# Patient Record
Sex: Female | Born: 1950 | Race: White | Hispanic: No | Marital: Married | Smoking: Never smoker
Health system: Southern US, Community
[De-identification: ages and names within clinical notes are randomized; demographics above are authoritative.]

## PROBLEM LIST (undated history)

## (undated) DIAGNOSIS — T7840XA Allergy, unspecified, initial encounter: Secondary | ICD-10-CM

## (undated) DIAGNOSIS — E0590 Thyrotoxicosis, unspecified without thyrotoxic crisis or storm: Secondary | ICD-10-CM

## (undated) DIAGNOSIS — R42 Dizziness and giddiness: Secondary | ICD-10-CM

## (undated) DIAGNOSIS — M1990 Unspecified osteoarthritis, unspecified site: Secondary | ICD-10-CM

## (undated) DIAGNOSIS — H269 Unspecified cataract: Secondary | ICD-10-CM

## (undated) DIAGNOSIS — K5790 Diverticulosis of intestine, part unspecified, without perforation or abscess without bleeding: Secondary | ICD-10-CM

## (undated) DIAGNOSIS — M810 Age-related osteoporosis without current pathological fracture: Secondary | ICD-10-CM

## (undated) HISTORY — PX: BLADDER REPAIR: SHX76

## (undated) HISTORY — DX: Dizziness and giddiness: R42

## (undated) HISTORY — DX: Allergy, unspecified, initial encounter: T78.40XA

## (undated) HISTORY — DX: Diverticulosis of intestine, part unspecified, without perforation or abscess without bleeding: K57.90

## (undated) HISTORY — DX: Unspecified cataract: H26.9

## (undated) HISTORY — DX: Unspecified osteoarthritis, unspecified site: M19.90

## (undated) HISTORY — DX: Age-related osteoporosis without current pathological fracture: M81.0

## (undated) HISTORY — DX: Thyrotoxicosis, unspecified without thyrotoxic crisis or storm: E05.90

---
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
## 1980-01-03 HISTORY — PX: VAGINAL HYSTERECTOMY: SHX2639

## 1985-01-02 HISTORY — PX: APPENDECTOMY: SHX54

## 1997-10-12 ENCOUNTER — Other Ambulatory Visit: Admission: RE | Admit: 1997-10-12 | Discharge: 1997-10-12 | Payer: Self-pay | Admitting: *Deleted

## 1999-09-14 ENCOUNTER — Other Ambulatory Visit: Admission: RE | Admit: 1999-09-14 | Discharge: 1999-09-14 | Payer: Self-pay | Admitting: *Deleted

## 2004-09-06 ENCOUNTER — Ambulatory Visit: Payer: Self-pay | Admitting: Oncology

## 2004-12-15 ENCOUNTER — Ambulatory Visit: Payer: Self-pay | Admitting: Oncology

## 2005-04-30 ENCOUNTER — Ambulatory Visit: Payer: Self-pay | Admitting: Oncology

## 2005-05-02 LAB — CBC WITH DIFFERENTIAL/PLATELET
BASO%: 0.4 % (ref 0.0–2.0)
EOS%: 2 % (ref 0.0–7.0)
LYMPH%: 39.2 % (ref 14.0–48.0)
MCH: 31.3 pg (ref 26.0–34.0)
MCHC: 34.2 g/dL (ref 32.0–36.0)
MCV: 91.8 fL (ref 81.0–101.0)
MONO#: 0.4 10*3/uL (ref 0.1–0.9)
MONO%: 6.8 % (ref 0.0–13.0)
NEUT%: 51.6 % (ref 39.6–76.8)
Platelets: 191 10*3/uL (ref 145–400)
RBC: 4.15 10*6/uL (ref 3.70–5.32)
WBC: 6.2 10*3/uL (ref 3.9–10.0)

## 2005-10-31 ENCOUNTER — Ambulatory Visit: Payer: Self-pay

## 2007-12-02 ENCOUNTER — Ambulatory Visit: Payer: Self-pay | Admitting: Oncology

## 2011-07-14 ENCOUNTER — Encounter: Payer: Self-pay | Admitting: Internal Medicine

## 2012-01-26 ENCOUNTER — Encounter: Payer: Self-pay | Admitting: Internal Medicine

## 2012-03-02 HISTORY — PX: OTHER SURGICAL HISTORY: SHX169

## 2012-03-13 ENCOUNTER — Ambulatory Visit (AMBULATORY_SURGERY_CENTER): Payer: BC Managed Care – PPO | Admitting: *Deleted

## 2012-03-13 VITALS — Ht 62.0 in | Wt 146.8 lb

## 2012-03-13 MED ORDER — MOVIPREP 100 G PO SOLR: ORAL | Status: DC

## 2012-03-14 ENCOUNTER — Encounter: Payer: Self-pay | Admitting: Internal Medicine

## 2012-03-15 ENCOUNTER — Encounter: Payer: Self-pay | Admitting: Internal Medicine

## 2012-03-26 ENCOUNTER — Ambulatory Visit (AMBULATORY_SURGERY_CENTER): Payer: BC Managed Care – PPO | Admitting: Internal Medicine

## 2012-03-26 ENCOUNTER — Encounter: Payer: Self-pay | Admitting: Internal Medicine

## 2012-03-26 VITALS — BP 115/66 | HR 67 | Temp 97.9°F | Resp 17 | Ht 62.0 in | Wt 146.0 lb

## 2012-03-26 DIAGNOSIS — Z1211 Encounter for screening for malignant neoplasm of colon: Secondary | ICD-10-CM

## 2012-03-26 MED ORDER — SODIUM CHLORIDE 0.9 % IV SOLN: 500.0000 mL | INTRAVENOUS | Status: DC

## 2012-03-26 NOTE — Patient Instructions (Addendum)

## 2012-03-26 NOTE — Progress Notes (Signed)
Patient did not experience any of the following events: a burn prior to discharge; a fall within the facility; wrong site/side/patient/procedure/implant event; or a hospital transfer or hospital admission upon discharge from the facility. (G8907) Patient did not have preoperative order for IV antibiotic SSI prophylaxis. (G8918)  

## 2012-03-26 NOTE — Op Note (Signed)
[facility] [address] [place], [address]   COLONOSCOPY PROCEDURE REPORT  PATIENT: Leslie Ashley, Leslie Ashley  MR#: [id number] BIRTHDATE: [birth year] , 62  yrs. old GENDER: Female ENDOSCOPIST: Hart Carwin, MD REFERRED BY:  Guerry Bruin, M.D. PROCEDURE DATE:  03/26/2012 PROCEDURE:   Colonoscopy, screening ASA CLASS:   Class II INDICATIONS:Average risk patient for colon cancer and last colon 09/2001- int.  hems. MEDICATIONS: MAC sedation, administered by CRNA and propofol (Diprivan) 250mg  IV  DESCRIPTION OF PROCEDURE:   After the risks and benefits and of the procedure were explained, informed consent was obtained.  A digital rectal exam revealed no abnormalities of the rectum.    The [facility] [id number]-[id number]  endoscope was introduced through the anus and advanced to the cecum, which was identified by both the appendix and ileocecal valve .  The quality of the prep was good, using MoviPrep .  The instrument was then slowly withdrawn as the colon was fully examined.     COLON FINDINGS: Mild diverticulosis was noted in the sigmoid colon. Retroflexed views revealed no abnormalities.     The scope was then withdrawn from the patient and the procedure completed.  COMPLICATIONS: There were no complications. ENDOSCOPIC IMPRESSION: Mild diverticulosis was noted in the sigmoid colon  RECOMMENDATIONS: High fiber diet   REPEAT EXAM: In 10 year(s)  for Colonoscopy.  cc:  _______________________________ eSignedHart Carwin, MD 03/26/2012 10:46 AM

## 2012-03-27 ENCOUNTER — Telehealth: Payer: Self-pay

## 2012-03-27 NOTE — Telephone Encounter (Signed)
  Follow up Call-  Call back number 03/26/2012  Post procedure Call Back phone  # [phone]  Permission to leave phone message Yes     Patient questions:  Do you have a fever, pain , or abdominal swelling? no Pain Score  0 *  Have you tolerated food without any problems? yes  Have you been able to return to your normal activities? yes  Do you have any questions about your discharge instructions: Diet   no Medications  no Follow up visit  no  Do you have questions or concerns about your Care? no  Actions: * If pain score is 4 or above: No action needed, pain <4.

## 2013-12-23 ENCOUNTER — Encounter: Payer: Self-pay | Admitting: *Deleted

## 2014-01-20 DIAGNOSIS — D649 Anemia, unspecified: Secondary | ICD-10-CM | POA: Diagnosis not present

## 2014-01-20 DIAGNOSIS — K529 Noninfective gastroenteritis and colitis, unspecified: Secondary | ICD-10-CM | POA: Diagnosis not present

## 2014-01-20 DIAGNOSIS — K633 Ulcer of intestine: Secondary | ICD-10-CM | POA: Diagnosis not present

## 2014-02-05 DIAGNOSIS — D649 Anemia, unspecified: Secondary | ICD-10-CM | POA: Diagnosis not present

## 2014-02-05 DIAGNOSIS — D509 Iron deficiency anemia, unspecified: Secondary | ICD-10-CM | POA: Diagnosis not present

## 2014-02-17 ENCOUNTER — Ambulatory Visit: Payer: BC Managed Care – PPO | Admitting: Internal Medicine

## 2014-02-18 ENCOUNTER — Encounter: Payer: Self-pay | Admitting: Internal Medicine

## 2014-02-18 NOTE — Progress Notes (Signed)
Patient ID: Leslie Ashley, female   DOB: 1950/08/12, 64 y.o.   MRN: [id number] The patient's chart has been reviewed by Dr. Olevia Perches  and the recommendations are noted below.  Follow-up advised. Schedule patient for next available appointment. Outcome of communication with the patient:  Letter mailed

## 2014-08-10 DIAGNOSIS — D6489 Other specified anemias: Secondary | ICD-10-CM | POA: Diagnosis not present

## 2014-08-10 DIAGNOSIS — K5190 Ulcerative colitis, unspecified, without complications: Secondary | ICD-10-CM | POA: Diagnosis not present

## 2015-01-07 DIAGNOSIS — Z13820 Encounter for screening for osteoporosis: Secondary | ICD-10-CM | POA: Diagnosis not present

## 2015-01-07 DIAGNOSIS — M810 Age-related osteoporosis without current pathological fracture: Secondary | ICD-10-CM | POA: Diagnosis not present

## 2015-01-07 DIAGNOSIS — Z780 Asymptomatic menopausal state: Secondary | ICD-10-CM | POA: Diagnosis not present

## 2015-01-07 DIAGNOSIS — Z1231 Encounter for screening mammogram for malignant neoplasm of breast: Secondary | ICD-10-CM | POA: Diagnosis not present

## 2015-03-31 DIAGNOSIS — H524 Presbyopia: Secondary | ICD-10-CM | POA: Diagnosis not present

## 2015-03-31 DIAGNOSIS — H35363 Drusen (degenerative) of macula, bilateral: Secondary | ICD-10-CM | POA: Diagnosis not present

## 2015-03-31 DIAGNOSIS — H2513 Age-related nuclear cataract, bilateral: Secondary | ICD-10-CM | POA: Diagnosis not present

## 2015-05-23 DIAGNOSIS — M1990 Unspecified osteoarthritis, unspecified site: Secondary | ICD-10-CM | POA: Diagnosis not present

## 2015-05-23 DIAGNOSIS — Y998 Other external cause status: Secondary | ICD-10-CM | POA: Diagnosis not present

## 2015-05-23 DIAGNOSIS — S0501XA Injury of conjunctiva and corneal abrasion without foreign body, right eye, initial encounter: Secondary | ICD-10-CM | POA: Diagnosis not present

## 2015-05-23 DIAGNOSIS — E039 Hypothyroidism, unspecified: Secondary | ICD-10-CM | POA: Diagnosis not present

## 2015-09-15 DIAGNOSIS — Z23 Encounter for immunization: Secondary | ICD-10-CM | POA: Diagnosis not present

## 2015-12-21 DIAGNOSIS — E0500 Thyrotoxicosis with diffuse goiter without thyrotoxic crisis or storm: Secondary | ICD-10-CM | POA: Diagnosis not present

## 2015-12-21 DIAGNOSIS — E7800 Pure hypercholesterolemia, unspecified: Secondary | ICD-10-CM | POA: Diagnosis not present

## 2015-12-21 DIAGNOSIS — Z0000 Encounter for general adult medical examination without abnormal findings: Secondary | ICD-10-CM | POA: Diagnosis not present

## 2015-12-21 DIAGNOSIS — M810 Age-related osteoporosis without current pathological fracture: Secondary | ICD-10-CM | POA: Diagnosis not present

## 2015-12-22 DIAGNOSIS — D696 Thrombocytopenia, unspecified: Secondary | ICD-10-CM | POA: Diagnosis not present

## 2015-12-22 DIAGNOSIS — H8110 Benign paroxysmal vertigo, unspecified ear: Secondary | ICD-10-CM | POA: Diagnosis not present

## 2015-12-22 DIAGNOSIS — Z6828 Body mass index (BMI) 28.0-28.9, adult: Secondary | ICD-10-CM | POA: Diagnosis not present

## 2015-12-22 DIAGNOSIS — D509 Iron deficiency anemia, unspecified: Secondary | ICD-10-CM | POA: Diagnosis not present

## 2015-12-22 DIAGNOSIS — E0500 Thyrotoxicosis with diffuse goiter without thyrotoxic crisis or storm: Secondary | ICD-10-CM | POA: Diagnosis not present

## 2015-12-22 DIAGNOSIS — E7800 Pure hypercholesterolemia, unspecified: Secondary | ICD-10-CM | POA: Diagnosis not present

## 2015-12-22 DIAGNOSIS — M1990 Unspecified osteoarthritis, unspecified site: Secondary | ICD-10-CM | POA: Diagnosis not present

## 2015-12-22 DIAGNOSIS — Z0000 Encounter for general adult medical examination without abnormal findings: Secondary | ICD-10-CM | POA: Diagnosis not present

## 2015-12-22 DIAGNOSIS — Z23 Encounter for immunization: Secondary | ICD-10-CM | POA: Diagnosis not present

## 2015-12-22 DIAGNOSIS — M810 Age-related osteoporosis without current pathological fracture: Secondary | ICD-10-CM | POA: Diagnosis not present

## 2016-01-13 DIAGNOSIS — Z1212 Encounter for screening for malignant neoplasm of rectum: Secondary | ICD-10-CM | POA: Diagnosis not present

## 2016-01-18 DIAGNOSIS — N6002 Solitary cyst of left breast: Secondary | ICD-10-CM | POA: Diagnosis not present

## 2016-01-18 DIAGNOSIS — N6320 Unspecified lump in the left breast, unspecified quadrant: Secondary | ICD-10-CM | POA: Diagnosis not present

## 2016-03-13 DIAGNOSIS — H25013 Cortical age-related cataract, bilateral: Secondary | ICD-10-CM | POA: Diagnosis not present

## 2016-03-13 DIAGNOSIS — H35363 Drusen (degenerative) of macula, bilateral: Secondary | ICD-10-CM | POA: Diagnosis not present

## 2016-03-13 DIAGNOSIS — H524 Presbyopia: Secondary | ICD-10-CM | POA: Diagnosis not present

## 2016-03-13 DIAGNOSIS — H35033 Hypertensive retinopathy, bilateral: Secondary | ICD-10-CM | POA: Diagnosis not present

## 2016-03-13 DIAGNOSIS — H2513 Age-related nuclear cataract, bilateral: Secondary | ICD-10-CM | POA: Diagnosis not present

## 2016-04-03 ENCOUNTER — Emergency Department (HOSPITAL_COMMUNITY)
Admission: EM | Admit: 2016-04-03 | Discharge: 2016-04-03 | Disposition: A | Discharge status: Home / Self Care | Payer: Medicare Other | Attending: Emergency Medicine | Admitting: Emergency Medicine

## 2016-04-03 ENCOUNTER — Emergency Department (HOSPITAL_COMMUNITY): Payer: Medicare Other

## 2016-04-03 ENCOUNTER — Encounter (HOSPITAL_COMMUNITY): Payer: Self-pay | Admitting: Emergency Medicine

## 2016-04-03 DIAGNOSIS — J029 Acute pharyngitis, unspecified: Secondary | ICD-10-CM | POA: Diagnosis not present

## 2016-04-03 DIAGNOSIS — J0100 Acute maxillary sinusitis, unspecified: Secondary | ICD-10-CM | POA: Diagnosis not present

## 2016-04-03 DIAGNOSIS — Z7982 Long term (current) use of aspirin: Secondary | ICD-10-CM | POA: Insufficient documentation

## 2016-04-03 DIAGNOSIS — R05 Cough: Secondary | ICD-10-CM | POA: Diagnosis present

## 2016-04-03 DIAGNOSIS — Z79899 Other long term (current) drug therapy: Secondary | ICD-10-CM | POA: Insufficient documentation

## 2016-04-03 LAB — I-STAT CHEM 8, ED
BUN: 15 mg/dL (ref 6–20)
CALCIUM ION: 1.19 mmol/L (ref 1.15–1.40)
CHLORIDE: 101 mmol/L (ref 101–111)
Creatinine, Ser: 0.7 mg/dL (ref 0.44–1.00)
GLUCOSE: 91 mg/dL (ref 65–99)
HCT: 39 % (ref 36.0–46.0)
Hemoglobin: 13.3 g/dL (ref 12.0–15.0)
Potassium: 3.8 mmol/L (ref 3.5–5.1)
Sodium: 138 mmol/L (ref 135–145)
TCO2: 28 mmol/L (ref 0–100)

## 2016-04-03 MED ORDER — AMOXICILLIN 500 MG PO CAPS: 500.0000 mg | ORAL_CAPSULE | Freq: Three times a day (TID) | ORAL | 0 refills | Status: AC

## 2016-04-03 NOTE — ED Triage Notes (Signed)
Pt c/o sore throat onset 2 days ago, yesterday onset sweats, chills, sinus pain, sneezing, sinus drainage.

## 2016-04-03 NOTE — Discharge Instructions (Signed)
Drink plenty of fluids take Tylenol or Motrin for fever or aches. Follow-up next week if not improving

## 2016-04-03 NOTE — ED Notes (Signed)
PT DISCHARGED. INSTRUCTIONS AND PRESCRIPTION GIVEN. AAOX4. PT IN NO APPARENT DISTRESS OR PAIN. THE OPPORTUNITY TO ASK QUESTIONS WAS PROVIDED. 

## 2016-04-03 NOTE — ED Provider Notes (Signed)
[facility] DEPT Provider Note   CSN: [id number] Arrival date & time: 04/03/16  1118     History   Chief Complaint Chief Complaint  Patient presents with  . Cough    HPI Leslie Ashley is a 66 y.o. female.  Patient complains a sore throat cough and sinus congestion. Patient exercises are full and she has a lot of discharge from her nose    Cough  This is a new problem. The current episode started more than 2 days ago. The problem occurs constantly. The problem has not changed since onset.The cough is non-productive. There has been no fever (Patient does complain of chills). Pertinent negatives include no chest pain and no headaches. She has tried nothing for the symptoms. The treatment provided no relief.    Past Medical History:  Diagnosis Date  . Allergy   . Cataract   . Diverticulosis   . Hyperthyroidism   . Osteoarthritis   . Osteoporosis   . Vertigo     There are no active problems to display for this patient.   Past Surgical History:  Procedure Laterality Date  . APPENDECTOMY  1987  . [place], 2004  . growth ear  1956   bilateral  . TONSILLECTOMY  1956  . tube in ear  03/2012   right  . VAGINAL HYSTERECTOMY  1982    OB History    No data available       Home Medications    Prior to Admission medications   Medication Sig Start Date End Date Taking? Authorizing Provider  alendronate (FOSAMAX) 70 MG tablet Take 70 mg by mouth every 7 (seven) days. Take with a full glass of water on an empty stomach.    Historical Provider, MD  amoxicillin (AMOXIL) 500 MG capsule Take 1 capsule (500 mg total) by mouth 3 (three) times daily. 04/03/16   Milton Ferguson, MD  aspirin 81 MG tablet Take 81 mg by mouth daily.    Historical Provider, MD  Calcium Carbonate-Vitamin D (CALCIUM + D PO) Take by mouth. Takes 3000 mg daily    Historical Provider, MD  Cholecalciferol (VITAMIN D PO) Take by mouth every other day.    Historical Provider, MD  diazepam  (VALIUM) 5 MG tablet Take 5 mg by mouth as needed for anxiety.    Historical Provider, MD  levothyroxine (SYNTHROID, LEVOTHROID) 137 MCG tablet Take 137 mcg by mouth daily.    Historical Provider, MD  MELATONIN ER PO Take by mouth at bedtime.    Historical Provider, MD  Multiple Vitamin (MULTIVITAMIN) tablet Take 1 tablet by mouth 2 (two) times daily.    Historical Provider, MD  triamterene-hydrochlorothiazide (DYAZIDE) 37.5-25 MG per capsule  03/26/12   Historical Provider, MD    Family History Family History  Problem Relation Age of Onset  . Colon cancer Neg Hx   . Stomach cancer Neg Hx     Social History Social History  Substance Use Topics  . Smoking status: Never Smoker  . Smokeless tobacco: Never Used  . Alcohol use No     Comment: rare     Allergies   Patient has no known allergies.   Review of Systems Review of Systems  Constitutional: Negative for appetite change and fatigue.  HENT: Positive for congestion. Negative for ear discharge and sinus pressure.        Frontal and maxillary sinus pain  Eyes: Negative for discharge.  Respiratory: Positive for cough.   Cardiovascular: Negative for  chest pain.  Gastrointestinal: Negative for abdominal pain and diarrhea.  Genitourinary: Negative for frequency and hematuria.  Musculoskeletal: Negative for back pain.  Skin: Negative for rash.  Neurological: Negative for seizures and headaches.  Psychiatric/Behavioral: Negative for hallucinations.     Physical Exam Updated Vital Signs BP 132/61   Pulse 66   Temp 97.7 F (36.5 C) (Oral)   Resp 20   Ht 5\' 2"  (1.575 m)   Wt 146 lb (66.2 kg)   SpO2 100%   BMI 26.70 kg/m   Physical Exam  Constitutional: She is oriented to person, place, and time. She appears well-developed.  HENT:  Head: Normocephalic.  Tenderness over maxillary sinuses  Eyes: Conjunctivae and EOM are normal. No scleral icterus.  Neck: Neck supple. No thyromegaly present.  Cardiovascular: Normal  rate and regular rhythm.  Exam reveals no gallop and no friction rub.   No murmur heard. Pulmonary/Chest: No stridor. She has no wheezes. She has no rales. She exhibits no tenderness.  Abdominal: She exhibits no distension. There is no tenderness. There is no rebound.  Musculoskeletal: Normal range of motion. She exhibits no edema.  Lymphadenopathy:    She has no cervical adenopathy.  Neurological: She is oriented to person, place, and time. She exhibits normal muscle tone. Coordination normal.  Skin: No rash noted. No erythema.  Psychiatric: She has a normal mood and affect. Her behavior is normal.     ED Treatments / Results  Labs (all labs ordered are listed, but only abnormal results are displayed) Labs Reviewed  I-STAT CHEM 8, ED    EKG  EKG Interpretation None       Radiology Dg Chest 2 View  Result Date: 04/03/2016 CLINICAL DATA:  Sore throat onset 2 days ago. EXAM: CHEST  2 VIEW COMPARISON:  None. FINDINGS: The heart size and mediastinal contours are within normal limits. Both lungs are clear. The visualized skeletal structures are unremarkable. IMPRESSION: No active cardiopulmonary disease. Electronically Signed   By: Kathreen Devoid   On: 04/03/2016 12:27    Procedures Procedures (including critical care time)  Medications Ordered in ED Medications - No data to display   Initial Impression / Assessment and Plan / ED Course  I have reviewed the triage vital signs and the nursing notes.  Pertinent labs & imaging results that were available during my care of the patient were reviewed by me and considered in my medical decision making (see chart for details).     Suspect sinusitis patient will be placed on amoxicillin and follow-up with her PCP if not improving  Final Clinical Impressions(s) / ED Diagnoses   Final diagnoses:  Subacute maxillary sinusitis    New Prescriptions New Prescriptions   AMOXICILLIN (AMOXIL) 500 MG CAPSULE    Take 1 capsule (500 mg  total) by mouth 3 (three) times daily.     Milton Ferguson, MD 04/03/16 [phone]

## 2016-07-31 DIAGNOSIS — H209 Unspecified iridocyclitis: Secondary | ICD-10-CM | POA: Diagnosis not present

## 2016-07-31 DIAGNOSIS — H18892 Other specified disorders of cornea, left eye: Secondary | ICD-10-CM | POA: Diagnosis not present

## 2016-08-17 DIAGNOSIS — H18892 Other specified disorders of cornea, left eye: Secondary | ICD-10-CM | POA: Diagnosis not present

## 2016-08-17 DIAGNOSIS — H209 Unspecified iridocyclitis: Secondary | ICD-10-CM | POA: Diagnosis not present

## 2016-08-17 DIAGNOSIS — H01009 Unspecified blepharitis unspecified eye, unspecified eyelid: Secondary | ICD-10-CM | POA: Diagnosis not present

## 2016-12-11 DIAGNOSIS — Z23 Encounter for immunization: Secondary | ICD-10-CM | POA: Diagnosis not present

## 2017-01-04 DIAGNOSIS — E0500 Thyrotoxicosis with diffuse goiter without thyrotoxic crisis or storm: Secondary | ICD-10-CM | POA: Diagnosis not present

## 2017-01-04 DIAGNOSIS — M810 Age-related osteoporosis without current pathological fracture: Secondary | ICD-10-CM | POA: Diagnosis not present

## 2017-01-04 DIAGNOSIS — R82998 Other abnormal findings in urine: Secondary | ICD-10-CM | POA: Diagnosis not present

## 2017-01-04 DIAGNOSIS — E7800 Pure hypercholesterolemia, unspecified: Secondary | ICD-10-CM | POA: Diagnosis not present

## 2017-01-11 DIAGNOSIS — Z1389 Encounter for screening for other disorder: Secondary | ICD-10-CM | POA: Diagnosis not present

## 2017-01-11 DIAGNOSIS — Z6829 Body mass index (BMI) 29.0-29.9, adult: Secondary | ICD-10-CM | POA: Diagnosis not present

## 2017-01-11 DIAGNOSIS — M810 Age-related osteoporosis without current pathological fracture: Secondary | ICD-10-CM | POA: Diagnosis not present

## 2017-01-11 DIAGNOSIS — E0500 Thyrotoxicosis with diffuse goiter without thyrotoxic crisis or storm: Secondary | ICD-10-CM | POA: Diagnosis not present

## 2017-01-11 DIAGNOSIS — M1990 Unspecified osteoarthritis, unspecified site: Secondary | ICD-10-CM | POA: Diagnosis not present

## 2017-01-11 DIAGNOSIS — E7800 Pure hypercholesterolemia, unspecified: Secondary | ICD-10-CM | POA: Diagnosis not present

## 2017-01-11 DIAGNOSIS — Z0000 Encounter for general adult medical examination without abnormal findings: Secondary | ICD-10-CM | POA: Diagnosis not present

## 2017-01-16 DIAGNOSIS — Z1212 Encounter for screening for malignant neoplasm of rectum: Secondary | ICD-10-CM | POA: Diagnosis not present

## 2017-01-22 DIAGNOSIS — Z1231 Encounter for screening mammogram for malignant neoplasm of breast: Secondary | ICD-10-CM | POA: Diagnosis not present

## 2017-03-13 DIAGNOSIS — H35033 Hypertensive retinopathy, bilateral: Secondary | ICD-10-CM | POA: Diagnosis not present

## 2017-03-13 DIAGNOSIS — H35363 Drusen (degenerative) of macula, bilateral: Secondary | ICD-10-CM | POA: Diagnosis not present

## 2017-03-13 DIAGNOSIS — H01009 Unspecified blepharitis unspecified eye, unspecified eyelid: Secondary | ICD-10-CM | POA: Diagnosis not present

## 2017-03-13 DIAGNOSIS — H25013 Cortical age-related cataract, bilateral: Secondary | ICD-10-CM | POA: Diagnosis not present

## 2017-03-13 DIAGNOSIS — H2513 Age-related nuclear cataract, bilateral: Secondary | ICD-10-CM | POA: Diagnosis not present

## 2017-04-11 DIAGNOSIS — H01009 Unspecified blepharitis unspecified eye, unspecified eyelid: Secondary | ICD-10-CM | POA: Diagnosis not present

## 2017-04-11 DIAGNOSIS — H2513 Age-related nuclear cataract, bilateral: Secondary | ICD-10-CM | POA: Diagnosis not present

## 2017-04-11 DIAGNOSIS — H25013 Cortical age-related cataract, bilateral: Secondary | ICD-10-CM | POA: Diagnosis not present

## 2017-04-11 DIAGNOSIS — H35033 Hypertensive retinopathy, bilateral: Secondary | ICD-10-CM | POA: Diagnosis not present

## 2017-04-11 DIAGNOSIS — H35363 Drusen (degenerative) of macula, bilateral: Secondary | ICD-10-CM | POA: Diagnosis not present

## 2017-05-01 DIAGNOSIS — H2513 Age-related nuclear cataract, bilateral: Secondary | ICD-10-CM | POA: Diagnosis not present

## 2017-05-01 DIAGNOSIS — H25013 Cortical age-related cataract, bilateral: Secondary | ICD-10-CM | POA: Diagnosis not present

## 2017-05-01 DIAGNOSIS — H2511 Age-related nuclear cataract, right eye: Secondary | ICD-10-CM | POA: Diagnosis not present

## 2017-05-23 DIAGNOSIS — H25811 Combined forms of age-related cataract, right eye: Secondary | ICD-10-CM | POA: Diagnosis not present

## 2017-05-23 DIAGNOSIS — H2511 Age-related nuclear cataract, right eye: Secondary | ICD-10-CM | POA: Diagnosis not present

## 2017-05-30 DIAGNOSIS — H2512 Age-related nuclear cataract, left eye: Secondary | ICD-10-CM | POA: Diagnosis not present

## 2017-05-30 DIAGNOSIS — H25012 Cortical age-related cataract, left eye: Secondary | ICD-10-CM | POA: Diagnosis not present

## 2017-06-06 DIAGNOSIS — H25812 Combined forms of age-related cataract, left eye: Secondary | ICD-10-CM | POA: Diagnosis not present

## 2017-06-06 DIAGNOSIS — H2512 Age-related nuclear cataract, left eye: Secondary | ICD-10-CM | POA: Diagnosis not present

## 2017-06-21 DIAGNOSIS — M1812 Unilateral primary osteoarthritis of first carpometacarpal joint, left hand: Secondary | ICD-10-CM | POA: Diagnosis not present

## 2017-06-21 DIAGNOSIS — M65311 Trigger thumb, right thumb: Secondary | ICD-10-CM | POA: Diagnosis not present

## 2017-06-21 DIAGNOSIS — M1811 Unilateral primary osteoarthritis of first carpometacarpal joint, right hand: Secondary | ICD-10-CM | POA: Diagnosis not present

## 2017-07-16 DIAGNOSIS — M65311 Trigger thumb, right thumb: Secondary | ICD-10-CM | POA: Diagnosis not present

## 2017-07-16 DIAGNOSIS — M180 Bilateral primary osteoarthritis of first carpometacarpal joints: Secondary | ICD-10-CM | POA: Diagnosis not present

## 2017-09-07 DIAGNOSIS — Z23 Encounter for immunization: Secondary | ICD-10-CM | POA: Diagnosis not present

## 2017-10-05 DIAGNOSIS — D3611 Benign neoplasm of peripheral nerves and autonomic nervous system of face, head, and neck: Secondary | ICD-10-CM | POA: Diagnosis not present

## 2017-10-05 DIAGNOSIS — L821 Other seborrheic keratosis: Secondary | ICD-10-CM | POA: Diagnosis not present

## 2017-10-05 DIAGNOSIS — D2339 Other benign neoplasm of skin of other parts of face: Secondary | ICD-10-CM | POA: Diagnosis not present

## 2017-10-05 DIAGNOSIS — D485 Neoplasm of uncertain behavior of skin: Secondary | ICD-10-CM | POA: Diagnosis not present

## 2017-10-08 DIAGNOSIS — E0500 Thyrotoxicosis with diffuse goiter without thyrotoxic crisis or storm: Secondary | ICD-10-CM | POA: Diagnosis not present

## 2017-10-08 DIAGNOSIS — H04123 Dry eye syndrome of bilateral lacrimal glands: Secondary | ICD-10-CM | POA: Diagnosis not present

## 2017-10-26 IMAGING — CR DG CHEST 2V
2 series · 2 of 2 positions shown · non-contrast
Comparison: None.

CLINICAL DATA: Sore throat onset 2 days ago.

EXAM:
CHEST  2 VIEW

[w chest pa]
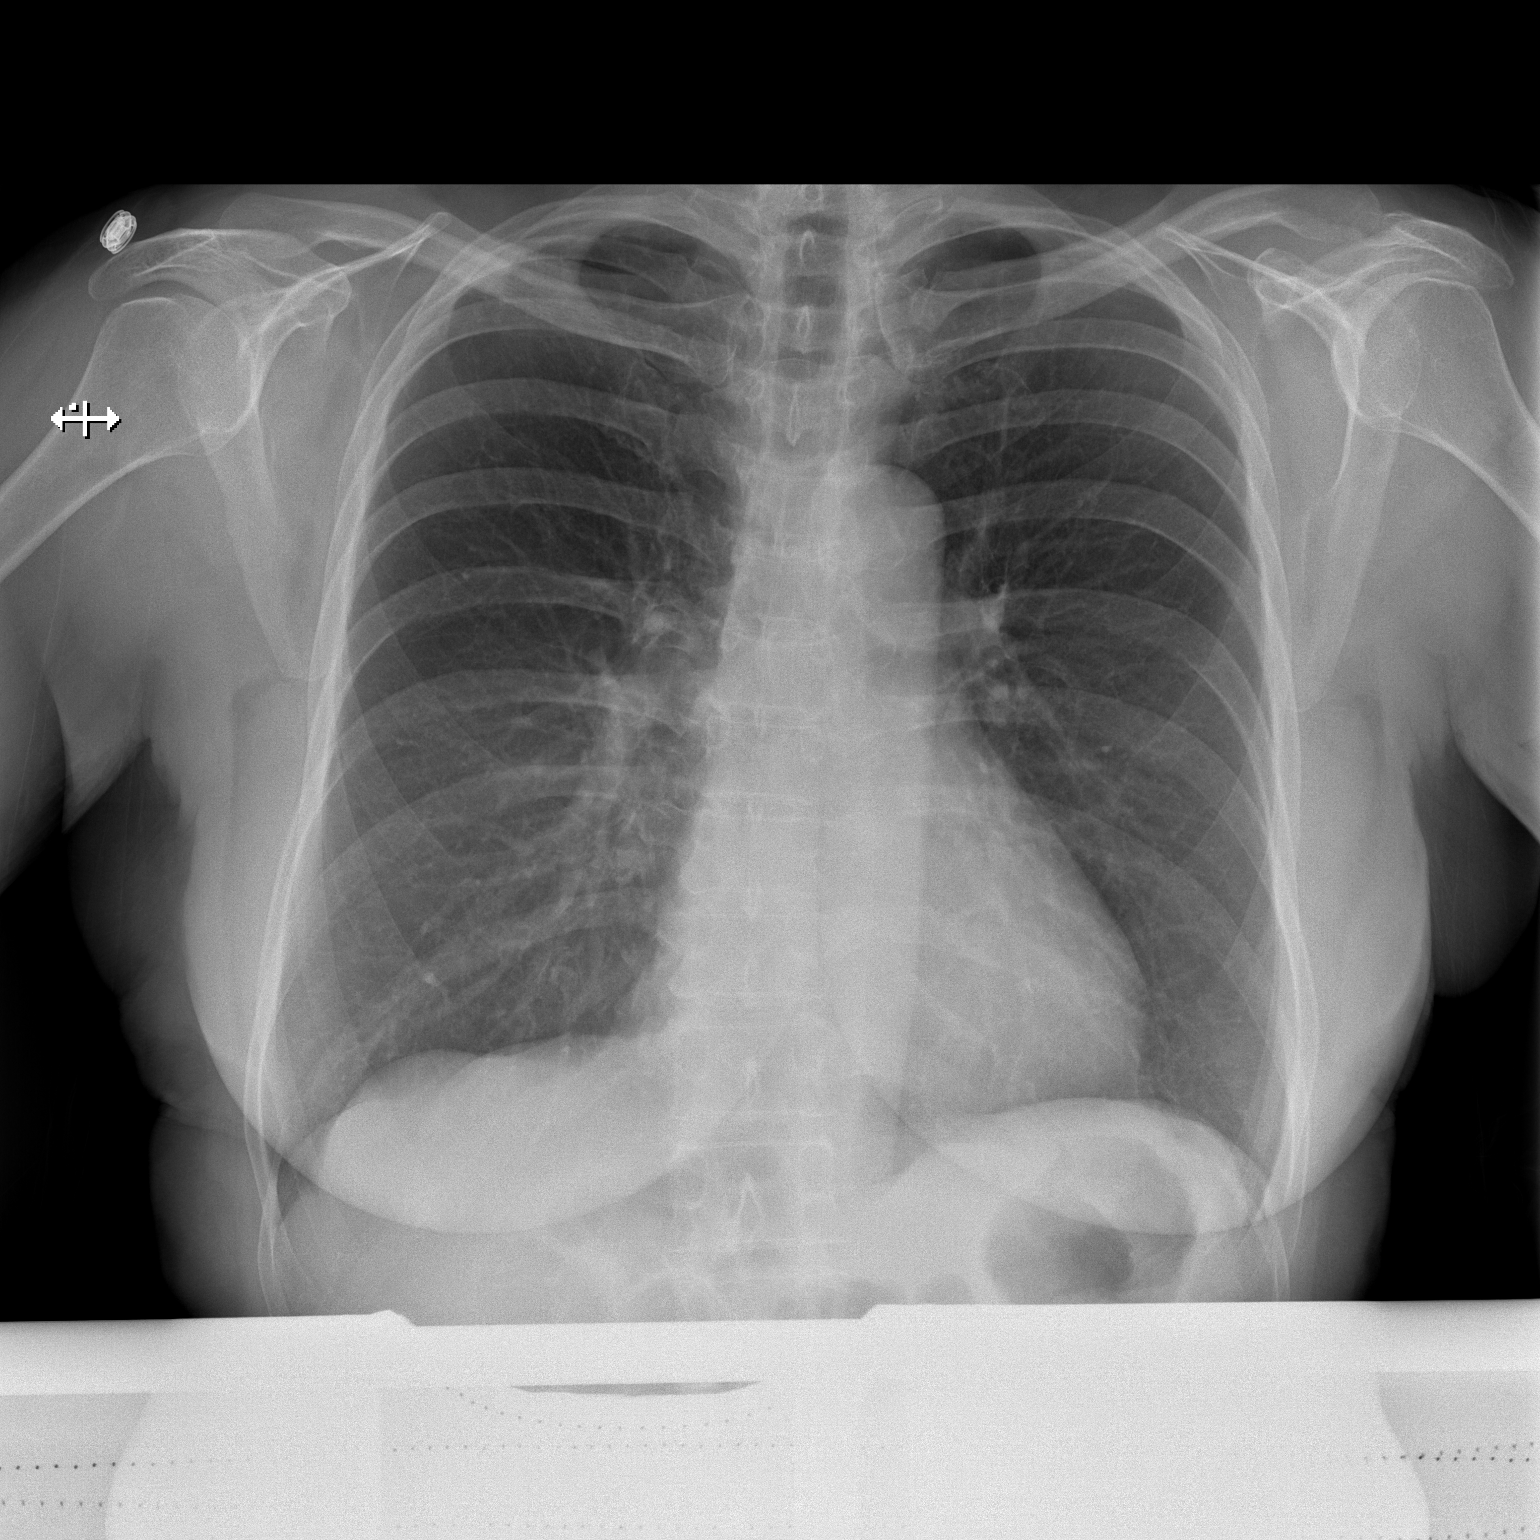

[w chest lat]
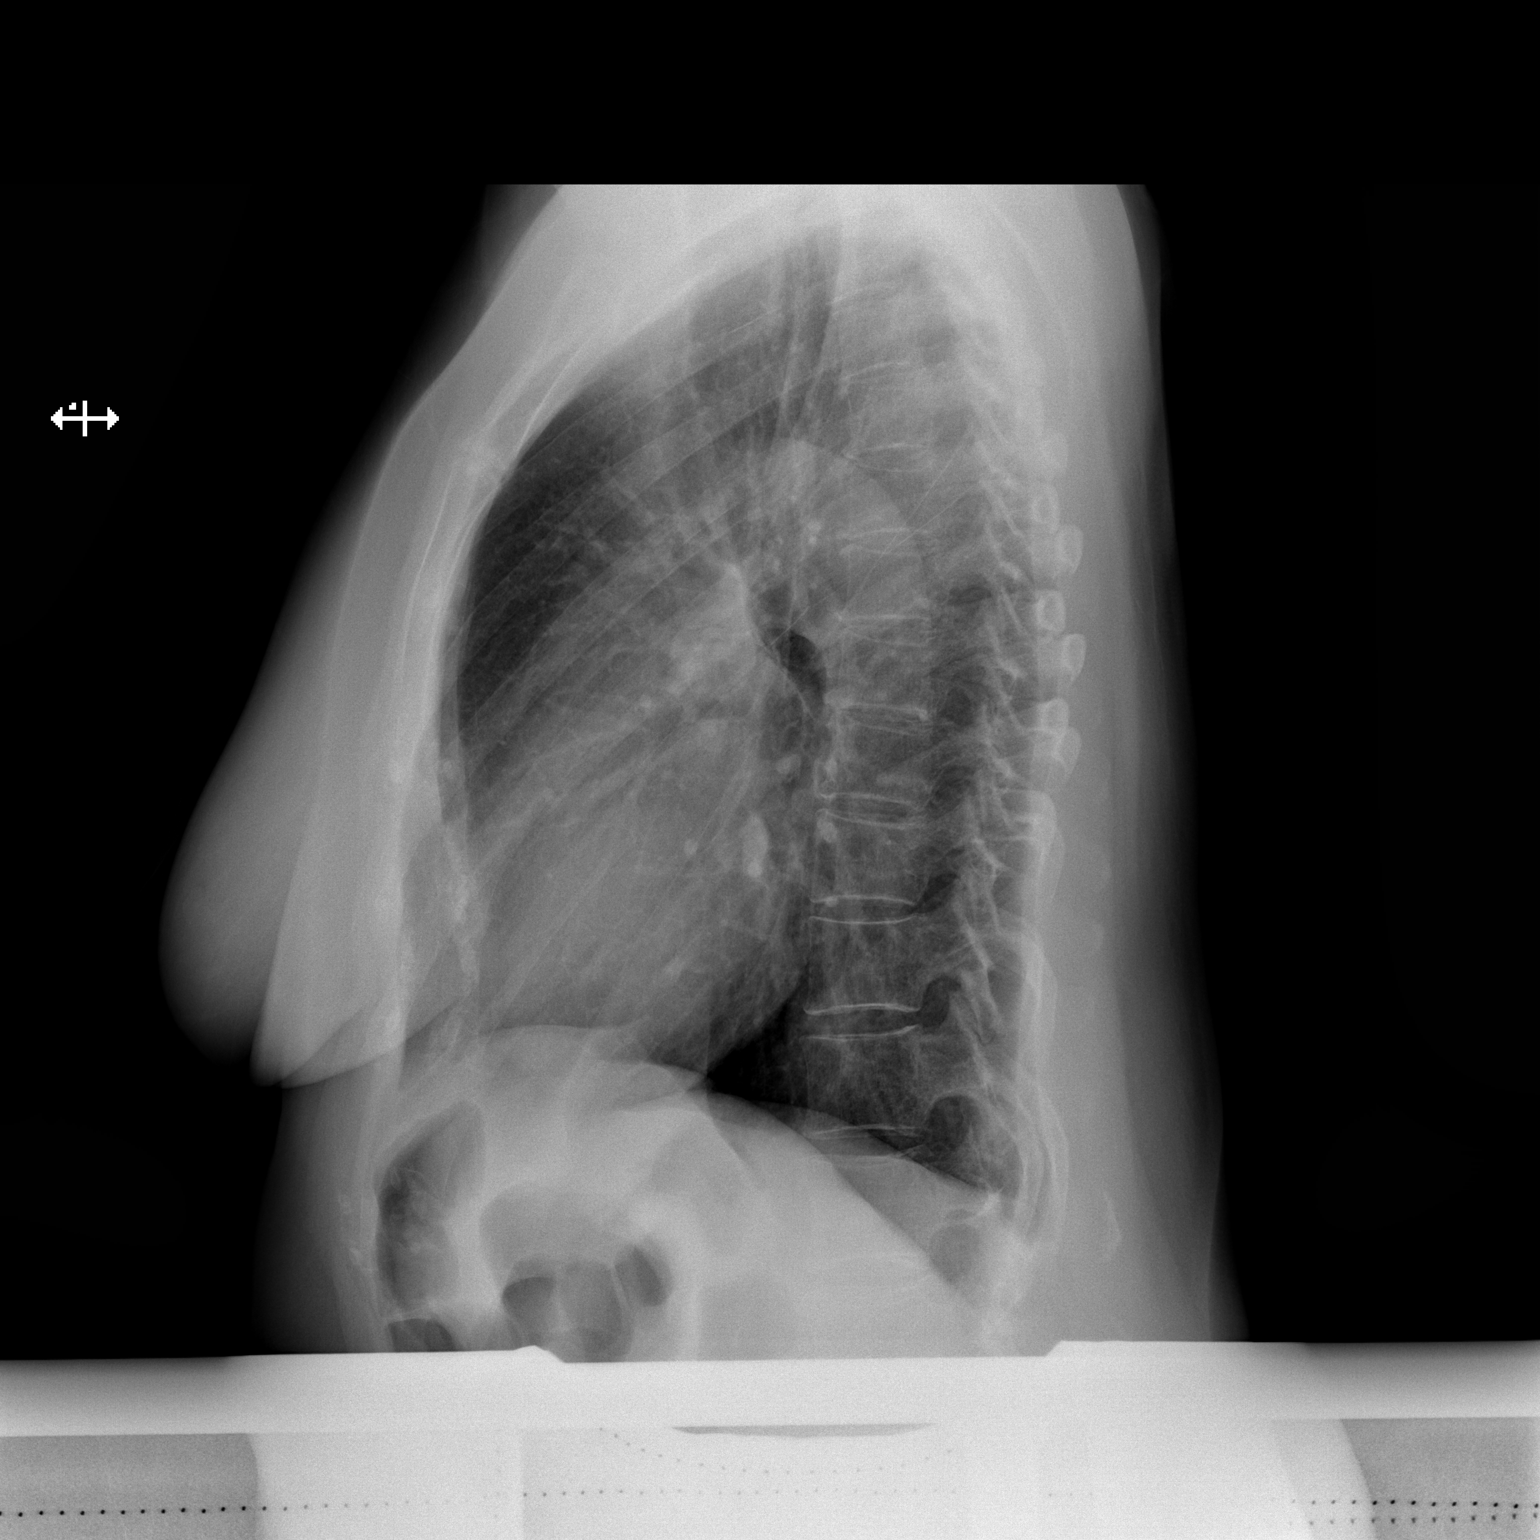

[2 of 2 positions shown; findings below may reference images not displayed]

FINDINGS: The heart size and mediastinal contours are within normal limits.
Both lungs are clear. The visualized skeletal structures are
unremarkable.
IMPRESSION: No active cardiopulmonary disease.

## 2018-01-07 DIAGNOSIS — E7800 Pure hypercholesterolemia, unspecified: Secondary | ICD-10-CM | POA: Diagnosis not present

## 2018-01-07 DIAGNOSIS — R82998 Other abnormal findings in urine: Secondary | ICD-10-CM | POA: Diagnosis not present

## 2018-01-07 DIAGNOSIS — E0500 Thyrotoxicosis with diffuse goiter without thyrotoxic crisis or storm: Secondary | ICD-10-CM | POA: Diagnosis not present

## 2018-01-07 DIAGNOSIS — M810 Age-related osteoporosis without current pathological fracture: Secondary | ICD-10-CM | POA: Diagnosis not present

## 2018-01-10 DIAGNOSIS — Z1212 Encounter for screening for malignant neoplasm of rectum: Secondary | ICD-10-CM | POA: Diagnosis not present

## 2018-01-14 DIAGNOSIS — Z1389 Encounter for screening for other disorder: Secondary | ICD-10-CM | POA: Diagnosis not present

## 2018-01-14 DIAGNOSIS — M1990 Unspecified osteoarthritis, unspecified site: Secondary | ICD-10-CM | POA: Diagnosis not present

## 2018-01-14 DIAGNOSIS — E7800 Pure hypercholesterolemia, unspecified: Secondary | ICD-10-CM | POA: Diagnosis not present

## 2018-01-14 DIAGNOSIS — M810 Age-related osteoporosis without current pathological fracture: Secondary | ICD-10-CM | POA: Diagnosis not present

## 2018-01-14 DIAGNOSIS — H35039 Hypertensive retinopathy, unspecified eye: Secondary | ICD-10-CM | POA: Diagnosis not present

## 2018-01-14 DIAGNOSIS — E0500 Thyrotoxicosis with diffuse goiter without thyrotoxic crisis or storm: Secondary | ICD-10-CM | POA: Diagnosis not present

## 2018-01-14 DIAGNOSIS — Z1331 Encounter for screening for depression: Secondary | ICD-10-CM | POA: Diagnosis not present

## 2018-01-14 DIAGNOSIS — Z1339 Encounter for screening examination for other mental health and behavioral disorders: Secondary | ICD-10-CM | POA: Diagnosis not present

## 2018-01-14 DIAGNOSIS — H8113 Benign paroxysmal vertigo, bilateral: Secondary | ICD-10-CM | POA: Diagnosis not present

## 2018-01-14 DIAGNOSIS — Z0000 Encounter for general adult medical examination without abnormal findings: Secondary | ICD-10-CM | POA: Diagnosis not present

## 2018-01-14 DIAGNOSIS — Z6829 Body mass index (BMI) 29.0-29.9, adult: Secondary | ICD-10-CM | POA: Diagnosis not present

## 2018-01-23 DIAGNOSIS — Z1231 Encounter for screening mammogram for malignant neoplasm of breast: Secondary | ICD-10-CM | POA: Diagnosis not present

## 2018-02-11 DIAGNOSIS — M25561 Pain in right knee: Secondary | ICD-10-CM | POA: Diagnosis not present

## 2018-02-11 DIAGNOSIS — M25562 Pain in left knee: Secondary | ICD-10-CM | POA: Diagnosis not present

## 2018-02-11 DIAGNOSIS — S8001XA Contusion of right knee, initial encounter: Secondary | ICD-10-CM | POA: Diagnosis not present

## 2018-02-20 DIAGNOSIS — R05 Cough: Secondary | ICD-10-CM | POA: Diagnosis not present

## 2018-02-20 DIAGNOSIS — Z6830 Body mass index (BMI) 30.0-30.9, adult: Secondary | ICD-10-CM | POA: Diagnosis not present

## 2018-02-20 DIAGNOSIS — J0100 Acute maxillary sinusitis, unspecified: Secondary | ICD-10-CM | POA: Diagnosis not present

## 2018-02-28 DIAGNOSIS — N3946 Mixed incontinence: Secondary | ICD-10-CM | POA: Diagnosis not present

## 2018-06-11 DIAGNOSIS — H43813 Vitreous degeneration, bilateral: Secondary | ICD-10-CM | POA: Diagnosis not present

## 2018-06-11 DIAGNOSIS — H04123 Dry eye syndrome of bilateral lacrimal glands: Secondary | ICD-10-CM | POA: Diagnosis not present

## 2018-06-11 DIAGNOSIS — H52203 Unspecified astigmatism, bilateral: Secondary | ICD-10-CM | POA: Diagnosis not present

## 2018-06-11 DIAGNOSIS — H35033 Hypertensive retinopathy, bilateral: Secondary | ICD-10-CM | POA: Diagnosis not present

## 2018-06-18 DIAGNOSIS — M720 Palmar fascial fibromatosis [Dupuytren]: Secondary | ICD-10-CM | POA: Diagnosis not present

## 2018-06-18 DIAGNOSIS — G5601 Carpal tunnel syndrome, right upper limb: Secondary | ICD-10-CM | POA: Diagnosis not present

## 2018-06-18 DIAGNOSIS — M79641 Pain in right hand: Secondary | ICD-10-CM | POA: Diagnosis not present

## 2018-06-24 DIAGNOSIS — G5601 Carpal tunnel syndrome, right upper limb: Secondary | ICD-10-CM | POA: Diagnosis not present

## 2018-06-28 DIAGNOSIS — G5601 Carpal tunnel syndrome, right upper limb: Secondary | ICD-10-CM | POA: Diagnosis not present

## 2018-06-28 DIAGNOSIS — M720 Palmar fascial fibromatosis [Dupuytren]: Secondary | ICD-10-CM | POA: Diagnosis not present

## 2018-07-22 DIAGNOSIS — M13841 Other specified arthritis, right hand: Secondary | ICD-10-CM | POA: Diagnosis not present

## 2018-07-22 DIAGNOSIS — M720 Palmar fascial fibromatosis [Dupuytren]: Secondary | ICD-10-CM | POA: Diagnosis not present

## 2018-07-22 DIAGNOSIS — G5601 Carpal tunnel syndrome, right upper limb: Secondary | ICD-10-CM | POA: Diagnosis not present

## 2018-07-22 DIAGNOSIS — M65311 Trigger thumb, right thumb: Secondary | ICD-10-CM | POA: Diagnosis not present

## 2018-10-24 DIAGNOSIS — Z23 Encounter for immunization: Secondary | ICD-10-CM | POA: Diagnosis not present

## 2019-01-23 DIAGNOSIS — E782 Mixed hyperlipidemia: Secondary | ICD-10-CM | POA: Diagnosis not present

## 2019-01-23 DIAGNOSIS — M810 Age-related osteoporosis without current pathological fracture: Secondary | ICD-10-CM | POA: Diagnosis not present

## 2019-01-23 DIAGNOSIS — E038 Other specified hypothyroidism: Secondary | ICD-10-CM | POA: Diagnosis not present

## 2019-01-23 DIAGNOSIS — Z1231 Encounter for screening mammogram for malignant neoplasm of breast: Secondary | ICD-10-CM | POA: Diagnosis not present

## 2019-01-23 DIAGNOSIS — Z6830 Body mass index (BMI) 30.0-30.9, adult: Secondary | ICD-10-CM | POA: Diagnosis not present

## 2019-01-23 DIAGNOSIS — Z79899 Other long term (current) drug therapy: Secondary | ICD-10-CM | POA: Diagnosis not present

## 2019-01-23 DIAGNOSIS — R030 Elevated blood-pressure reading, without diagnosis of hypertension: Secondary | ICD-10-CM | POA: Diagnosis not present

## 2019-02-06 DIAGNOSIS — Z1331 Encounter for screening for depression: Secondary | ICD-10-CM | POA: Diagnosis not present

## 2019-02-06 DIAGNOSIS — Z79899 Other long term (current) drug therapy: Secondary | ICD-10-CM | POA: Diagnosis not present

## 2019-02-06 DIAGNOSIS — E038 Other specified hypothyroidism: Secondary | ICD-10-CM | POA: Diagnosis not present

## 2019-02-06 DIAGNOSIS — Z1231 Encounter for screening mammogram for malignant neoplasm of breast: Secondary | ICD-10-CM | POA: Diagnosis not present

## 2019-02-06 DIAGNOSIS — I10 Essential (primary) hypertension: Secondary | ICD-10-CM | POA: Diagnosis not present

## 2019-02-06 DIAGNOSIS — Z131 Encounter for screening for diabetes mellitus: Secondary | ICD-10-CM | POA: Diagnosis not present

## 2019-02-06 DIAGNOSIS — Z1211 Encounter for screening for malignant neoplasm of colon: Secondary | ICD-10-CM | POA: Diagnosis not present

## 2019-02-06 DIAGNOSIS — Z0001 Encounter for general adult medical examination with abnormal findings: Secondary | ICD-10-CM | POA: Diagnosis not present

## 2019-02-06 DIAGNOSIS — Z13820 Encounter for screening for osteoporosis: Secondary | ICD-10-CM | POA: Diagnosis not present

## 2019-02-06 DIAGNOSIS — R519 Headache, unspecified: Secondary | ICD-10-CM | POA: Diagnosis not present

## 2019-02-06 DIAGNOSIS — G5691 Unspecified mononeuropathy of right upper limb: Secondary | ICD-10-CM | POA: Diagnosis not present

## 2019-02-11 DIAGNOSIS — M810 Age-related osteoporosis without current pathological fracture: Secondary | ICD-10-CM | POA: Diagnosis not present

## 2019-02-11 DIAGNOSIS — Z1231 Encounter for screening mammogram for malignant neoplasm of breast: Secondary | ICD-10-CM | POA: Diagnosis not present

## 2019-02-11 DIAGNOSIS — E28310 Symptomatic premature menopause: Secondary | ICD-10-CM | POA: Diagnosis not present

## 2019-02-11 DIAGNOSIS — R519 Headache, unspecified: Secondary | ICD-10-CM | POA: Diagnosis not present

## 2019-02-11 DIAGNOSIS — M8589 Other specified disorders of bone density and structure, multiple sites: Secondary | ICD-10-CM | POA: Diagnosis not present

## 2019-02-11 DIAGNOSIS — M8588 Other specified disorders of bone density and structure, other site: Secondary | ICD-10-CM | POA: Diagnosis not present

## 2019-02-11 DIAGNOSIS — G935 Compression of brain: Secondary | ICD-10-CM | POA: Diagnosis not present

## 2019-02-19 DIAGNOSIS — N6002 Solitary cyst of left breast: Secondary | ICD-10-CM | POA: Diagnosis not present

## 2019-02-19 DIAGNOSIS — R922 Inconclusive mammogram: Secondary | ICD-10-CM | POA: Diagnosis not present

## 2019-03-21 DIAGNOSIS — G5691 Unspecified mononeuropathy of right upper limb: Secondary | ICD-10-CM | POA: Diagnosis not present

## 2019-03-24 DIAGNOSIS — E038 Other specified hypothyroidism: Secondary | ICD-10-CM | POA: Diagnosis not present

## 2019-03-27 DIAGNOSIS — G5601 Carpal tunnel syndrome, right upper limb: Secondary | ICD-10-CM | POA: Diagnosis not present

## 2019-03-27 DIAGNOSIS — Z79899 Other long term (current) drug therapy: Secondary | ICD-10-CM | POA: Diagnosis not present

## 2019-03-27 DIAGNOSIS — E038 Other specified hypothyroidism: Secondary | ICD-10-CM | POA: Diagnosis not present

## 2019-03-31 DIAGNOSIS — E038 Other specified hypothyroidism: Secondary | ICD-10-CM | POA: Diagnosis not present

## 2019-03-31 DIAGNOSIS — M79604 Pain in right leg: Secondary | ICD-10-CM | POA: Diagnosis not present

## 2019-03-31 DIAGNOSIS — Z79899 Other long term (current) drug therapy: Secondary | ICD-10-CM | POA: Diagnosis not present

## 2019-04-01 DIAGNOSIS — Z79899 Other long term (current) drug therapy: Secondary | ICD-10-CM | POA: Diagnosis not present

## 2019-04-01 DIAGNOSIS — M79604 Pain in right leg: Secondary | ICD-10-CM | POA: Diagnosis not present

## 2019-04-21 DIAGNOSIS — M1811 Unilateral primary osteoarthritis of first carpometacarpal joint, right hand: Secondary | ICD-10-CM | POA: Diagnosis not present

## 2019-04-21 DIAGNOSIS — G5601 Carpal tunnel syndrome, right upper limb: Secondary | ICD-10-CM | POA: Diagnosis not present

## 2019-04-28 DIAGNOSIS — R05 Cough: Secondary | ICD-10-CM | POA: Diagnosis not present

## 2019-04-28 DIAGNOSIS — J3089 Other allergic rhinitis: Secondary | ICD-10-CM | POA: Diagnosis not present

## 2019-04-28 DIAGNOSIS — I10 Essential (primary) hypertension: Secondary | ICD-10-CM | POA: Diagnosis not present

## 2019-04-28 DIAGNOSIS — Z79899 Other long term (current) drug therapy: Secondary | ICD-10-CM | POA: Diagnosis not present

## 2019-05-01 DIAGNOSIS — E890 Postprocedural hypothyroidism: Secondary | ICD-10-CM | POA: Diagnosis not present

## 2019-05-18 DIAGNOSIS — R531 Weakness: Secondary | ICD-10-CM | POA: Diagnosis not present

## 2019-05-18 DIAGNOSIS — G510 Bell's palsy: Secondary | ICD-10-CM | POA: Diagnosis not present

## 2019-05-18 DIAGNOSIS — R29810 Facial weakness: Secondary | ICD-10-CM | POA: Diagnosis not present

## 2019-05-18 DIAGNOSIS — R519 Headache, unspecified: Secondary | ICD-10-CM | POA: Diagnosis not present

## 2019-05-18 DIAGNOSIS — E039 Hypothyroidism, unspecified: Secondary | ICD-10-CM | POA: Diagnosis not present

## 2019-05-18 DIAGNOSIS — Z91040 Latex allergy status: Secondary | ICD-10-CM | POA: Diagnosis not present

## 2019-05-18 DIAGNOSIS — I10 Essential (primary) hypertension: Secondary | ICD-10-CM | POA: Diagnosis not present

## 2019-05-19 DIAGNOSIS — Z79899 Other long term (current) drug therapy: Secondary | ICD-10-CM | POA: Diagnosis not present

## 2019-05-19 DIAGNOSIS — E038 Other specified hypothyroidism: Secondary | ICD-10-CM | POA: Diagnosis not present

## 2019-05-23 DIAGNOSIS — Z79899 Other long term (current) drug therapy: Secondary | ICD-10-CM | POA: Diagnosis not present

## 2019-05-23 DIAGNOSIS — E039 Hypothyroidism, unspecified: Secondary | ICD-10-CM | POA: Diagnosis not present

## 2019-05-23 DIAGNOSIS — Z6831 Body mass index (BMI) 31.0-31.9, adult: Secondary | ICD-10-CM | POA: Diagnosis not present

## 2019-05-23 DIAGNOSIS — G510 Bell's palsy: Secondary | ICD-10-CM | POA: Diagnosis not present

## 2019-05-23 DIAGNOSIS — I10 Essential (primary) hypertension: Secondary | ICD-10-CM | POA: Diagnosis not present

## 2019-06-03 DIAGNOSIS — G510 Bell's palsy: Secondary | ICD-10-CM | POA: Diagnosis not present

## 2019-06-03 DIAGNOSIS — Z961 Presence of intraocular lens: Secondary | ICD-10-CM | POA: Diagnosis not present

## 2019-06-13 DIAGNOSIS — G5601 Carpal tunnel syndrome, right upper limb: Secondary | ICD-10-CM | POA: Diagnosis not present

## 2019-06-13 DIAGNOSIS — M8580 Other specified disorders of bone density and structure, unspecified site: Secondary | ICD-10-CM | POA: Diagnosis not present

## 2019-06-13 DIAGNOSIS — I10 Essential (primary) hypertension: Secondary | ICD-10-CM | POA: Diagnosis not present

## 2019-06-13 DIAGNOSIS — M1811 Unilateral primary osteoarthritis of first carpometacarpal joint, right hand: Secondary | ICD-10-CM | POA: Diagnosis not present

## 2019-06-13 DIAGNOSIS — Z91040 Latex allergy status: Secondary | ICD-10-CM | POA: Diagnosis not present

## 2019-06-13 DIAGNOSIS — E785 Hyperlipidemia, unspecified: Secondary | ICD-10-CM | POA: Diagnosis not present

## 2019-07-15 DIAGNOSIS — M79641 Pain in right hand: Secondary | ICD-10-CM | POA: Diagnosis not present

## 2019-07-15 DIAGNOSIS — M62541 Muscle wasting and atrophy, not elsewhere classified, right hand: Secondary | ICD-10-CM | POA: Diagnosis not present

## 2019-07-15 DIAGNOSIS — G5601 Carpal tunnel syndrome, right upper limb: Secondary | ICD-10-CM | POA: Diagnosis not present

## 2019-07-15 DIAGNOSIS — M25531 Pain in right wrist: Secondary | ICD-10-CM | POA: Diagnosis not present

## 2019-07-15 DIAGNOSIS — M25441 Effusion, right hand: Secondary | ICD-10-CM | POA: Diagnosis not present

## 2019-07-15 DIAGNOSIS — M25641 Stiffness of right hand, not elsewhere classified: Secondary | ICD-10-CM | POA: Diagnosis not present

## 2019-07-15 DIAGNOSIS — M1811 Unilateral primary osteoarthritis of first carpometacarpal joint, right hand: Secondary | ICD-10-CM | POA: Diagnosis not present

## 2019-07-15 DIAGNOSIS — M25631 Stiffness of right wrist, not elsewhere classified: Secondary | ICD-10-CM | POA: Diagnosis not present

## 2019-07-17 DIAGNOSIS — M25641 Stiffness of right hand, not elsewhere classified: Secondary | ICD-10-CM | POA: Diagnosis not present

## 2019-07-17 DIAGNOSIS — M25531 Pain in right wrist: Secondary | ICD-10-CM | POA: Diagnosis not present

## 2019-07-17 DIAGNOSIS — M1811 Unilateral primary osteoarthritis of first carpometacarpal joint, right hand: Secondary | ICD-10-CM | POA: Diagnosis not present

## 2019-07-17 DIAGNOSIS — G5601 Carpal tunnel syndrome, right upper limb: Secondary | ICD-10-CM | POA: Diagnosis not present

## 2019-07-17 DIAGNOSIS — M25441 Effusion, right hand: Secondary | ICD-10-CM | POA: Diagnosis not present

## 2019-07-17 DIAGNOSIS — M25631 Stiffness of right wrist, not elsewhere classified: Secondary | ICD-10-CM | POA: Diagnosis not present

## 2019-07-17 DIAGNOSIS — M62541 Muscle wasting and atrophy, not elsewhere classified, right hand: Secondary | ICD-10-CM | POA: Diagnosis not present

## 2019-07-17 DIAGNOSIS — M79641 Pain in right hand: Secondary | ICD-10-CM | POA: Diagnosis not present

## 2019-07-22 DIAGNOSIS — M1811 Unilateral primary osteoarthritis of first carpometacarpal joint, right hand: Secondary | ICD-10-CM | POA: Diagnosis not present

## 2019-07-22 DIAGNOSIS — M62541 Muscle wasting and atrophy, not elsewhere classified, right hand: Secondary | ICD-10-CM | POA: Diagnosis not present

## 2019-07-22 DIAGNOSIS — G5601 Carpal tunnel syndrome, right upper limb: Secondary | ICD-10-CM | POA: Diagnosis not present

## 2019-07-22 DIAGNOSIS — M25631 Stiffness of right wrist, not elsewhere classified: Secondary | ICD-10-CM | POA: Diagnosis not present

## 2019-07-22 DIAGNOSIS — M25531 Pain in right wrist: Secondary | ICD-10-CM | POA: Diagnosis not present

## 2019-07-22 DIAGNOSIS — M79641 Pain in right hand: Secondary | ICD-10-CM | POA: Diagnosis not present

## 2019-07-22 DIAGNOSIS — M25641 Stiffness of right hand, not elsewhere classified: Secondary | ICD-10-CM | POA: Diagnosis not present

## 2019-07-22 DIAGNOSIS — M25441 Effusion, right hand: Secondary | ICD-10-CM | POA: Diagnosis not present

## 2019-07-24 DIAGNOSIS — M79641 Pain in right hand: Secondary | ICD-10-CM | POA: Diagnosis not present

## 2019-07-24 DIAGNOSIS — M25631 Stiffness of right wrist, not elsewhere classified: Secondary | ICD-10-CM | POA: Diagnosis not present

## 2019-07-24 DIAGNOSIS — M25531 Pain in right wrist: Secondary | ICD-10-CM | POA: Diagnosis not present

## 2019-07-24 DIAGNOSIS — M25441 Effusion, right hand: Secondary | ICD-10-CM | POA: Diagnosis not present

## 2019-07-24 DIAGNOSIS — M1811 Unilateral primary osteoarthritis of first carpometacarpal joint, right hand: Secondary | ICD-10-CM | POA: Diagnosis not present

## 2019-07-24 DIAGNOSIS — M25641 Stiffness of right hand, not elsewhere classified: Secondary | ICD-10-CM | POA: Diagnosis not present

## 2019-07-24 DIAGNOSIS — G5601 Carpal tunnel syndrome, right upper limb: Secondary | ICD-10-CM | POA: Diagnosis not present

## 2019-07-24 DIAGNOSIS — M62541 Muscle wasting and atrophy, not elsewhere classified, right hand: Secondary | ICD-10-CM | POA: Diagnosis not present

## 2019-07-29 DIAGNOSIS — M79641 Pain in right hand: Secondary | ICD-10-CM | POA: Diagnosis not present

## 2019-07-29 DIAGNOSIS — M62541 Muscle wasting and atrophy, not elsewhere classified, right hand: Secondary | ICD-10-CM | POA: Diagnosis not present

## 2019-07-29 DIAGNOSIS — M25441 Effusion, right hand: Secondary | ICD-10-CM | POA: Diagnosis not present

## 2019-07-29 DIAGNOSIS — M1811 Unilateral primary osteoarthritis of first carpometacarpal joint, right hand: Secondary | ICD-10-CM | POA: Diagnosis not present

## 2019-07-29 DIAGNOSIS — G5601 Carpal tunnel syndrome, right upper limb: Secondary | ICD-10-CM | POA: Diagnosis not present

## 2019-07-29 DIAGNOSIS — M25631 Stiffness of right wrist, not elsewhere classified: Secondary | ICD-10-CM | POA: Diagnosis not present

## 2019-07-29 DIAGNOSIS — M25641 Stiffness of right hand, not elsewhere classified: Secondary | ICD-10-CM | POA: Diagnosis not present

## 2019-07-29 DIAGNOSIS — M25531 Pain in right wrist: Secondary | ICD-10-CM | POA: Diagnosis not present

## 2019-08-01 DIAGNOSIS — M25631 Stiffness of right wrist, not elsewhere classified: Secondary | ICD-10-CM | POA: Diagnosis not present

## 2019-08-01 DIAGNOSIS — M62541 Muscle wasting and atrophy, not elsewhere classified, right hand: Secondary | ICD-10-CM | POA: Diagnosis not present

## 2019-08-01 DIAGNOSIS — M79641 Pain in right hand: Secondary | ICD-10-CM | POA: Diagnosis not present

## 2019-08-01 DIAGNOSIS — M25531 Pain in right wrist: Secondary | ICD-10-CM | POA: Diagnosis not present

## 2019-08-01 DIAGNOSIS — G5601 Carpal tunnel syndrome, right upper limb: Secondary | ICD-10-CM | POA: Diagnosis not present

## 2019-08-01 DIAGNOSIS — M25641 Stiffness of right hand, not elsewhere classified: Secondary | ICD-10-CM | POA: Diagnosis not present

## 2019-08-01 DIAGNOSIS — M1811 Unilateral primary osteoarthritis of first carpometacarpal joint, right hand: Secondary | ICD-10-CM | POA: Diagnosis not present

## 2019-08-01 DIAGNOSIS — M25441 Effusion, right hand: Secondary | ICD-10-CM | POA: Diagnosis not present

## 2019-08-04 DIAGNOSIS — E890 Postprocedural hypothyroidism: Secondary | ICD-10-CM | POA: Diagnosis not present

## 2019-08-05 DIAGNOSIS — M62541 Muscle wasting and atrophy, not elsewhere classified, right hand: Secondary | ICD-10-CM | POA: Diagnosis not present

## 2019-08-05 DIAGNOSIS — G5601 Carpal tunnel syndrome, right upper limb: Secondary | ICD-10-CM | POA: Diagnosis not present

## 2019-08-05 DIAGNOSIS — M25631 Stiffness of right wrist, not elsewhere classified: Secondary | ICD-10-CM | POA: Diagnosis not present

## 2019-08-05 DIAGNOSIS — M25641 Stiffness of right hand, not elsewhere classified: Secondary | ICD-10-CM | POA: Diagnosis not present

## 2019-08-05 DIAGNOSIS — M79641 Pain in right hand: Secondary | ICD-10-CM | POA: Diagnosis not present

## 2019-08-05 DIAGNOSIS — M1811 Unilateral primary osteoarthritis of first carpometacarpal joint, right hand: Secondary | ICD-10-CM | POA: Diagnosis not present

## 2019-08-05 DIAGNOSIS — M25441 Effusion, right hand: Secondary | ICD-10-CM | POA: Diagnosis not present

## 2019-08-05 DIAGNOSIS — M25531 Pain in right wrist: Secondary | ICD-10-CM | POA: Diagnosis not present

## 2019-08-07 DIAGNOSIS — M8580 Other specified disorders of bone density and structure, unspecified site: Secondary | ICD-10-CM | POA: Diagnosis not present

## 2019-08-07 DIAGNOSIS — M1811 Unilateral primary osteoarthritis of first carpometacarpal joint, right hand: Secondary | ICD-10-CM | POA: Diagnosis not present

## 2019-08-07 DIAGNOSIS — M25531 Pain in right wrist: Secondary | ICD-10-CM | POA: Diagnosis not present

## 2019-08-07 DIAGNOSIS — Z8639 Personal history of other endocrine, nutritional and metabolic disease: Secondary | ICD-10-CM | POA: Diagnosis not present

## 2019-08-07 DIAGNOSIS — Z79899 Other long term (current) drug therapy: Secondary | ICD-10-CM | POA: Diagnosis not present

## 2019-08-07 DIAGNOSIS — M25641 Stiffness of right hand, not elsewhere classified: Secondary | ICD-10-CM | POA: Diagnosis not present

## 2019-08-07 DIAGNOSIS — G5601 Carpal tunnel syndrome, right upper limb: Secondary | ICD-10-CM | POA: Diagnosis not present

## 2019-08-07 DIAGNOSIS — M25631 Stiffness of right wrist, not elsewhere classified: Secondary | ICD-10-CM | POA: Diagnosis not present

## 2019-08-07 DIAGNOSIS — E7800 Pure hypercholesterolemia, unspecified: Secondary | ICD-10-CM | POA: Diagnosis not present

## 2019-08-07 DIAGNOSIS — I10 Essential (primary) hypertension: Secondary | ICD-10-CM | POA: Diagnosis not present

## 2019-08-07 DIAGNOSIS — M25441 Effusion, right hand: Secondary | ICD-10-CM | POA: Diagnosis not present

## 2019-08-07 DIAGNOSIS — M62541 Muscle wasting and atrophy, not elsewhere classified, right hand: Secondary | ICD-10-CM | POA: Diagnosis not present

## 2019-08-07 DIAGNOSIS — M79641 Pain in right hand: Secondary | ICD-10-CM | POA: Diagnosis not present

## 2019-08-07 DIAGNOSIS — Z719 Counseling, unspecified: Secondary | ICD-10-CM | POA: Diagnosis not present

## 2019-08-10 DIAGNOSIS — I10 Essential (primary) hypertension: Secondary | ICD-10-CM | POA: Diagnosis not present

## 2019-08-10 DIAGNOSIS — S93401A Sprain of unspecified ligament of right ankle, initial encounter: Secondary | ICD-10-CM | POA: Diagnosis not present

## 2019-08-10 DIAGNOSIS — S99911A Unspecified injury of right ankle, initial encounter: Secondary | ICD-10-CM | POA: Diagnosis not present

## 2019-08-10 DIAGNOSIS — Z91040 Latex allergy status: Secondary | ICD-10-CM | POA: Diagnosis not present

## 2019-08-10 DIAGNOSIS — S99921A Unspecified injury of right foot, initial encounter: Secondary | ICD-10-CM | POA: Diagnosis not present

## 2019-08-11 DIAGNOSIS — M25531 Pain in right wrist: Secondary | ICD-10-CM | POA: Diagnosis not present

## 2019-08-11 DIAGNOSIS — M1811 Unilateral primary osteoarthritis of first carpometacarpal joint, right hand: Secondary | ICD-10-CM | POA: Diagnosis not present

## 2019-08-11 DIAGNOSIS — G5601 Carpal tunnel syndrome, right upper limb: Secondary | ICD-10-CM | POA: Diagnosis not present

## 2019-08-11 DIAGNOSIS — M25641 Stiffness of right hand, not elsewhere classified: Secondary | ICD-10-CM | POA: Diagnosis not present

## 2019-08-11 DIAGNOSIS — M25441 Effusion, right hand: Secondary | ICD-10-CM | POA: Diagnosis not present

## 2019-08-11 DIAGNOSIS — M25631 Stiffness of right wrist, not elsewhere classified: Secondary | ICD-10-CM | POA: Diagnosis not present

## 2019-08-11 DIAGNOSIS — M79641 Pain in right hand: Secondary | ICD-10-CM | POA: Diagnosis not present

## 2019-08-11 DIAGNOSIS — M62541 Muscle wasting and atrophy, not elsewhere classified, right hand: Secondary | ICD-10-CM | POA: Diagnosis not present

## 2019-08-14 DIAGNOSIS — G5601 Carpal tunnel syndrome, right upper limb: Secondary | ICD-10-CM | POA: Diagnosis not present

## 2019-08-14 DIAGNOSIS — M25631 Stiffness of right wrist, not elsewhere classified: Secondary | ICD-10-CM | POA: Diagnosis not present

## 2019-08-14 DIAGNOSIS — M79641 Pain in right hand: Secondary | ICD-10-CM | POA: Diagnosis not present

## 2019-08-14 DIAGNOSIS — M25441 Effusion, right hand: Secondary | ICD-10-CM | POA: Diagnosis not present

## 2019-08-14 DIAGNOSIS — M1811 Unilateral primary osteoarthritis of first carpometacarpal joint, right hand: Secondary | ICD-10-CM | POA: Diagnosis not present

## 2019-08-14 DIAGNOSIS — M25531 Pain in right wrist: Secondary | ICD-10-CM | POA: Diagnosis not present

## 2019-08-14 DIAGNOSIS — M62541 Muscle wasting and atrophy, not elsewhere classified, right hand: Secondary | ICD-10-CM | POA: Diagnosis not present

## 2019-08-14 DIAGNOSIS — M25641 Stiffness of right hand, not elsewhere classified: Secondary | ICD-10-CM | POA: Diagnosis not present

## 2019-08-19 DIAGNOSIS — M79641 Pain in right hand: Secondary | ICD-10-CM | POA: Diagnosis not present

## 2019-08-19 DIAGNOSIS — G5601 Carpal tunnel syndrome, right upper limb: Secondary | ICD-10-CM | POA: Diagnosis not present

## 2019-08-19 DIAGNOSIS — M25641 Stiffness of right hand, not elsewhere classified: Secondary | ICD-10-CM | POA: Diagnosis not present

## 2019-08-19 DIAGNOSIS — M25531 Pain in right wrist: Secondary | ICD-10-CM | POA: Diagnosis not present

## 2019-08-19 DIAGNOSIS — M1811 Unilateral primary osteoarthritis of first carpometacarpal joint, right hand: Secondary | ICD-10-CM | POA: Diagnosis not present

## 2019-08-19 DIAGNOSIS — M25441 Effusion, right hand: Secondary | ICD-10-CM | POA: Diagnosis not present

## 2019-08-19 DIAGNOSIS — M62541 Muscle wasting and atrophy, not elsewhere classified, right hand: Secondary | ICD-10-CM | POA: Diagnosis not present

## 2019-08-19 DIAGNOSIS — M25631 Stiffness of right wrist, not elsewhere classified: Secondary | ICD-10-CM | POA: Diagnosis not present

## 2019-08-21 DIAGNOSIS — M1811 Unilateral primary osteoarthritis of first carpometacarpal joint, right hand: Secondary | ICD-10-CM | POA: Diagnosis not present

## 2019-08-21 DIAGNOSIS — M25441 Effusion, right hand: Secondary | ICD-10-CM | POA: Diagnosis not present

## 2019-08-21 DIAGNOSIS — G5601 Carpal tunnel syndrome, right upper limb: Secondary | ICD-10-CM | POA: Diagnosis not present

## 2019-08-21 DIAGNOSIS — M25631 Stiffness of right wrist, not elsewhere classified: Secondary | ICD-10-CM | POA: Diagnosis not present

## 2019-08-21 DIAGNOSIS — M25641 Stiffness of right hand, not elsewhere classified: Secondary | ICD-10-CM | POA: Diagnosis not present

## 2019-08-21 DIAGNOSIS — M25531 Pain in right wrist: Secondary | ICD-10-CM | POA: Diagnosis not present

## 2019-08-21 DIAGNOSIS — M79641 Pain in right hand: Secondary | ICD-10-CM | POA: Diagnosis not present

## 2019-08-21 DIAGNOSIS — M62541 Muscle wasting and atrophy, not elsewhere classified, right hand: Secondary | ICD-10-CM | POA: Diagnosis not present

## 2019-08-26 DIAGNOSIS — M62541 Muscle wasting and atrophy, not elsewhere classified, right hand: Secondary | ICD-10-CM | POA: Diagnosis not present

## 2019-08-26 DIAGNOSIS — M25531 Pain in right wrist: Secondary | ICD-10-CM | POA: Diagnosis not present

## 2019-08-26 DIAGNOSIS — M25631 Stiffness of right wrist, not elsewhere classified: Secondary | ICD-10-CM | POA: Diagnosis not present

## 2019-08-26 DIAGNOSIS — M25441 Effusion, right hand: Secondary | ICD-10-CM | POA: Diagnosis not present

## 2019-08-26 DIAGNOSIS — M79641 Pain in right hand: Secondary | ICD-10-CM | POA: Diagnosis not present

## 2019-08-26 DIAGNOSIS — M25641 Stiffness of right hand, not elsewhere classified: Secondary | ICD-10-CM | POA: Diagnosis not present

## 2019-08-26 DIAGNOSIS — G5601 Carpal tunnel syndrome, right upper limb: Secondary | ICD-10-CM | POA: Diagnosis not present

## 2019-08-26 DIAGNOSIS — M1811 Unilateral primary osteoarthritis of first carpometacarpal joint, right hand: Secondary | ICD-10-CM | POA: Diagnosis not present

## 2019-08-27 DIAGNOSIS — Z4789 Encounter for other orthopedic aftercare: Secondary | ICD-10-CM | POA: Diagnosis not present

## 2019-08-27 DIAGNOSIS — M654 Radial styloid tenosynovitis [de Quervain]: Secondary | ICD-10-CM | POA: Diagnosis not present

## 2019-08-28 DIAGNOSIS — M25631 Stiffness of right wrist, not elsewhere classified: Secondary | ICD-10-CM | POA: Diagnosis not present

## 2019-08-28 DIAGNOSIS — M1811 Unilateral primary osteoarthritis of first carpometacarpal joint, right hand: Secondary | ICD-10-CM | POA: Diagnosis not present

## 2019-08-28 DIAGNOSIS — M25531 Pain in right wrist: Secondary | ICD-10-CM | POA: Diagnosis not present

## 2019-08-28 DIAGNOSIS — M25641 Stiffness of right hand, not elsewhere classified: Secondary | ICD-10-CM | POA: Diagnosis not present

## 2019-08-28 DIAGNOSIS — G5601 Carpal tunnel syndrome, right upper limb: Secondary | ICD-10-CM | POA: Diagnosis not present

## 2019-08-28 DIAGNOSIS — M25441 Effusion, right hand: Secondary | ICD-10-CM | POA: Diagnosis not present

## 2019-08-28 DIAGNOSIS — M79641 Pain in right hand: Secondary | ICD-10-CM | POA: Diagnosis not present

## 2019-08-28 DIAGNOSIS — M62541 Muscle wasting and atrophy, not elsewhere classified, right hand: Secondary | ICD-10-CM | POA: Diagnosis not present

## 2019-09-10 DIAGNOSIS — M25631 Stiffness of right wrist, not elsewhere classified: Secondary | ICD-10-CM | POA: Diagnosis not present

## 2019-09-10 DIAGNOSIS — G5601 Carpal tunnel syndrome, right upper limb: Secondary | ICD-10-CM | POA: Diagnosis not present

## 2019-09-10 DIAGNOSIS — M1811 Unilateral primary osteoarthritis of first carpometacarpal joint, right hand: Secondary | ICD-10-CM | POA: Diagnosis not present

## 2019-09-10 DIAGNOSIS — M79641 Pain in right hand: Secondary | ICD-10-CM | POA: Diagnosis not present

## 2019-09-10 DIAGNOSIS — M62541 Muscle wasting and atrophy, not elsewhere classified, right hand: Secondary | ICD-10-CM | POA: Diagnosis not present

## 2019-09-10 DIAGNOSIS — M25641 Stiffness of right hand, not elsewhere classified: Secondary | ICD-10-CM | POA: Diagnosis not present

## 2019-09-10 DIAGNOSIS — M25531 Pain in right wrist: Secondary | ICD-10-CM | POA: Diagnosis not present

## 2019-09-10 DIAGNOSIS — M25441 Effusion, right hand: Secondary | ICD-10-CM | POA: Diagnosis not present

## 2019-09-11 DIAGNOSIS — G5601 Carpal tunnel syndrome, right upper limb: Secondary | ICD-10-CM | POA: Diagnosis not present

## 2019-09-11 DIAGNOSIS — M25641 Stiffness of right hand, not elsewhere classified: Secondary | ICD-10-CM | POA: Diagnosis not present

## 2019-09-11 DIAGNOSIS — M62541 Muscle wasting and atrophy, not elsewhere classified, right hand: Secondary | ICD-10-CM | POA: Diagnosis not present

## 2019-09-11 DIAGNOSIS — M79641 Pain in right hand: Secondary | ICD-10-CM | POA: Diagnosis not present

## 2019-09-11 DIAGNOSIS — M1811 Unilateral primary osteoarthritis of first carpometacarpal joint, right hand: Secondary | ICD-10-CM | POA: Diagnosis not present

## 2019-09-11 DIAGNOSIS — M25531 Pain in right wrist: Secondary | ICD-10-CM | POA: Diagnosis not present

## 2019-09-11 DIAGNOSIS — M25441 Effusion, right hand: Secondary | ICD-10-CM | POA: Diagnosis not present

## 2019-09-11 DIAGNOSIS — M25631 Stiffness of right wrist, not elsewhere classified: Secondary | ICD-10-CM | POA: Diagnosis not present

## 2019-09-16 DIAGNOSIS — M79641 Pain in right hand: Secondary | ICD-10-CM | POA: Diagnosis not present

## 2019-09-16 DIAGNOSIS — M25631 Stiffness of right wrist, not elsewhere classified: Secondary | ICD-10-CM | POA: Diagnosis not present

## 2019-09-16 DIAGNOSIS — M25441 Effusion, right hand: Secondary | ICD-10-CM | POA: Diagnosis not present

## 2019-09-16 DIAGNOSIS — M62541 Muscle wasting and atrophy, not elsewhere classified, right hand: Secondary | ICD-10-CM | POA: Diagnosis not present

## 2019-09-16 DIAGNOSIS — M1811 Unilateral primary osteoarthritis of first carpometacarpal joint, right hand: Secondary | ICD-10-CM | POA: Diagnosis not present

## 2019-09-16 DIAGNOSIS — M25531 Pain in right wrist: Secondary | ICD-10-CM | POA: Diagnosis not present

## 2019-09-16 DIAGNOSIS — M25641 Stiffness of right hand, not elsewhere classified: Secondary | ICD-10-CM | POA: Diagnosis not present

## 2019-09-16 DIAGNOSIS — G5601 Carpal tunnel syndrome, right upper limb: Secondary | ICD-10-CM | POA: Diagnosis not present

## 2019-09-18 DIAGNOSIS — M25631 Stiffness of right wrist, not elsewhere classified: Secondary | ICD-10-CM | POA: Diagnosis not present

## 2019-09-18 DIAGNOSIS — M62541 Muscle wasting and atrophy, not elsewhere classified, right hand: Secondary | ICD-10-CM | POA: Diagnosis not present

## 2019-09-18 DIAGNOSIS — M1811 Unilateral primary osteoarthritis of first carpometacarpal joint, right hand: Secondary | ICD-10-CM | POA: Diagnosis not present

## 2019-09-18 DIAGNOSIS — G5601 Carpal tunnel syndrome, right upper limb: Secondary | ICD-10-CM | POA: Diagnosis not present

## 2019-09-18 DIAGNOSIS — M25641 Stiffness of right hand, not elsewhere classified: Secondary | ICD-10-CM | POA: Diagnosis not present

## 2019-09-18 DIAGNOSIS — M25531 Pain in right wrist: Secondary | ICD-10-CM | POA: Diagnosis not present

## 2019-09-18 DIAGNOSIS — M25441 Effusion, right hand: Secondary | ICD-10-CM | POA: Diagnosis not present

## 2019-09-18 DIAGNOSIS — M79641 Pain in right hand: Secondary | ICD-10-CM | POA: Diagnosis not present

## 2019-09-24 DIAGNOSIS — M25441 Effusion, right hand: Secondary | ICD-10-CM | POA: Diagnosis not present

## 2019-09-24 DIAGNOSIS — M25531 Pain in right wrist: Secondary | ICD-10-CM | POA: Diagnosis not present

## 2019-09-24 DIAGNOSIS — M1811 Unilateral primary osteoarthritis of first carpometacarpal joint, right hand: Secondary | ICD-10-CM | POA: Diagnosis not present

## 2019-09-24 DIAGNOSIS — M25631 Stiffness of right wrist, not elsewhere classified: Secondary | ICD-10-CM | POA: Diagnosis not present

## 2019-09-24 DIAGNOSIS — M79641 Pain in right hand: Secondary | ICD-10-CM | POA: Diagnosis not present

## 2019-09-24 DIAGNOSIS — M25641 Stiffness of right hand, not elsewhere classified: Secondary | ICD-10-CM | POA: Diagnosis not present

## 2019-09-24 DIAGNOSIS — G5601 Carpal tunnel syndrome, right upper limb: Secondary | ICD-10-CM | POA: Diagnosis not present

## 2019-09-24 DIAGNOSIS — M62541 Muscle wasting and atrophy, not elsewhere classified, right hand: Secondary | ICD-10-CM | POA: Diagnosis not present

## 2019-11-04 DIAGNOSIS — E782 Mixed hyperlipidemia: Secondary | ICD-10-CM | POA: Diagnosis not present

## 2019-11-04 DIAGNOSIS — Z79899 Other long term (current) drug therapy: Secondary | ICD-10-CM | POA: Diagnosis not present

## 2019-11-07 DIAGNOSIS — E782 Mixed hyperlipidemia: Secondary | ICD-10-CM | POA: Diagnosis not present

## 2019-11-07 DIAGNOSIS — E038 Other specified hypothyroidism: Secondary | ICD-10-CM | POA: Diagnosis not present

## 2019-11-07 DIAGNOSIS — I10 Essential (primary) hypertension: Secondary | ICD-10-CM | POA: Diagnosis not present

## 2019-11-07 DIAGNOSIS — Z79899 Other long term (current) drug therapy: Secondary | ICD-10-CM | POA: Diagnosis not present

## 2019-11-07 DIAGNOSIS — Z23 Encounter for immunization: Secondary | ICD-10-CM | POA: Diagnosis not present

## 2019-11-07 DIAGNOSIS — R7309 Other abnormal glucose: Secondary | ICD-10-CM | POA: Diagnosis not present
